# Patient Record
Sex: Male | Born: 1995 | Race: White | Hispanic: No | Marital: Single | Smoking: Current every day smoker
Health system: Southern US, Community
[De-identification: ages and names within clinical notes are randomized; demographics above are authoritative.]

## PROBLEM LIST (undated history)

## (undated) DIAGNOSIS — B019 Varicella without complication: Secondary | ICD-10-CM

## (undated) DIAGNOSIS — F329 Major depressive disorder, single episode, unspecified: Secondary | ICD-10-CM

## (undated) DIAGNOSIS — F909 Attention-deficit hyperactivity disorder, unspecified type: Secondary | ICD-10-CM

## (undated) DIAGNOSIS — F419 Anxiety disorder, unspecified: Secondary | ICD-10-CM

## (undated) DIAGNOSIS — F1291 Cannabis use, unspecified, in remission: Secondary | ICD-10-CM

## (undated) DIAGNOSIS — Z87898 Personal history of other specified conditions: Secondary | ICD-10-CM

## (undated) DIAGNOSIS — R55 Syncope and collapse: Secondary | ICD-10-CM

## (undated) DIAGNOSIS — F17210 Nicotine dependence, cigarettes, uncomplicated: Secondary | ICD-10-CM

## (undated) HISTORY — DX: Cannabis use, unspecified, in remission: F12.91

## (undated) HISTORY — DX: Syncope and collapse: R55

## (undated) HISTORY — DX: Nicotine dependence, cigarettes, uncomplicated: F17.210

## (undated) HISTORY — DX: Attention-deficit hyperactivity disorder, unspecified type: F90.9

## (undated) HISTORY — DX: Anxiety disorder, unspecified: F41.9

## (undated) HISTORY — DX: Major depressive disorder, single episode, unspecified: F32.9

## (undated) HISTORY — DX: Varicella without complication: B01.9

## (undated) HISTORY — DX: Personal history of other specified conditions: Z87.898

---
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
## 2017-04-14 IMAGING — CR DG CHEST 1V PORT
1 series · 3 of 3 positions shown · non-contrast
Comparison: None.

CLINICAL DATA: Unwitnessed fall with broken skateboard nearby.

EXAM:
PORTABLE CHEST - 1 VIEW

[Series 1: portable · 0.17mm/px · 3 of 3 slices shown]
[im 1/3]
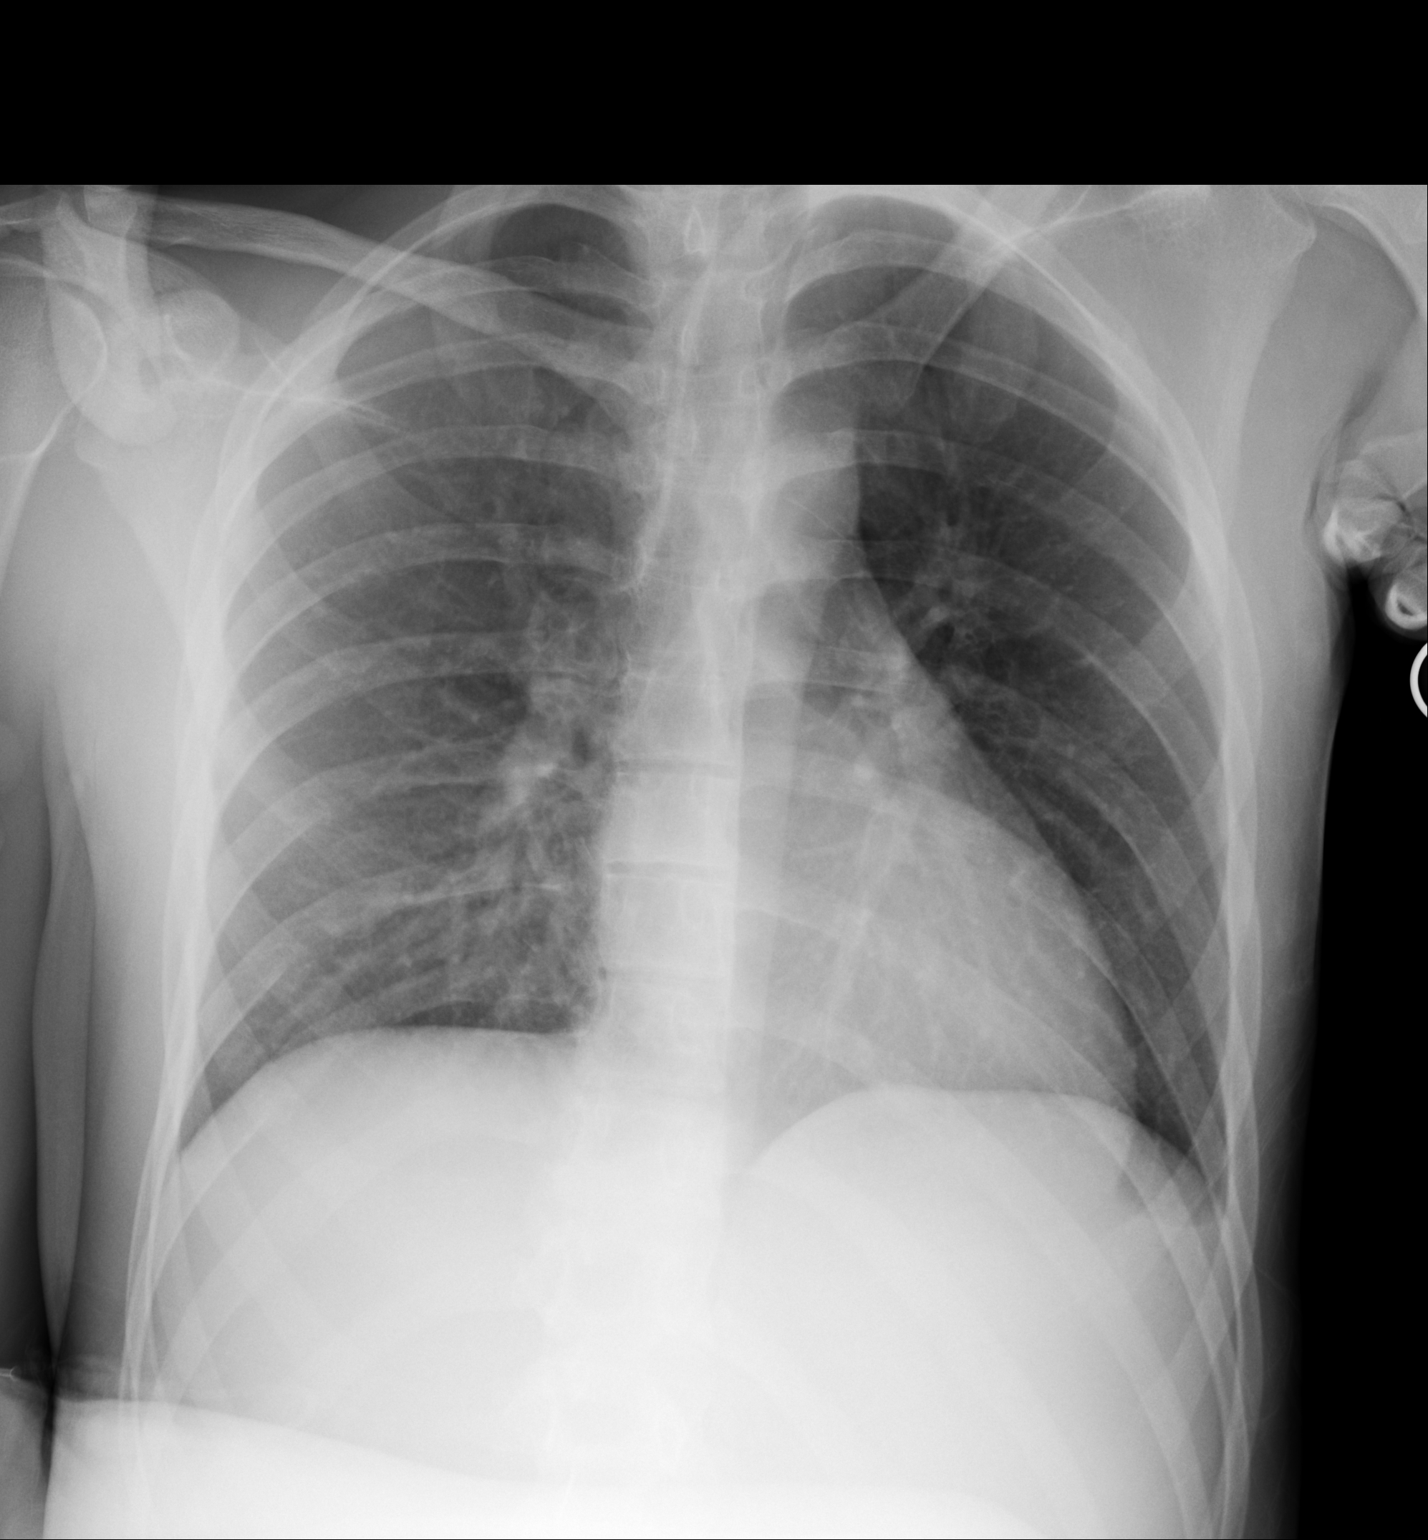
[im 2/3]
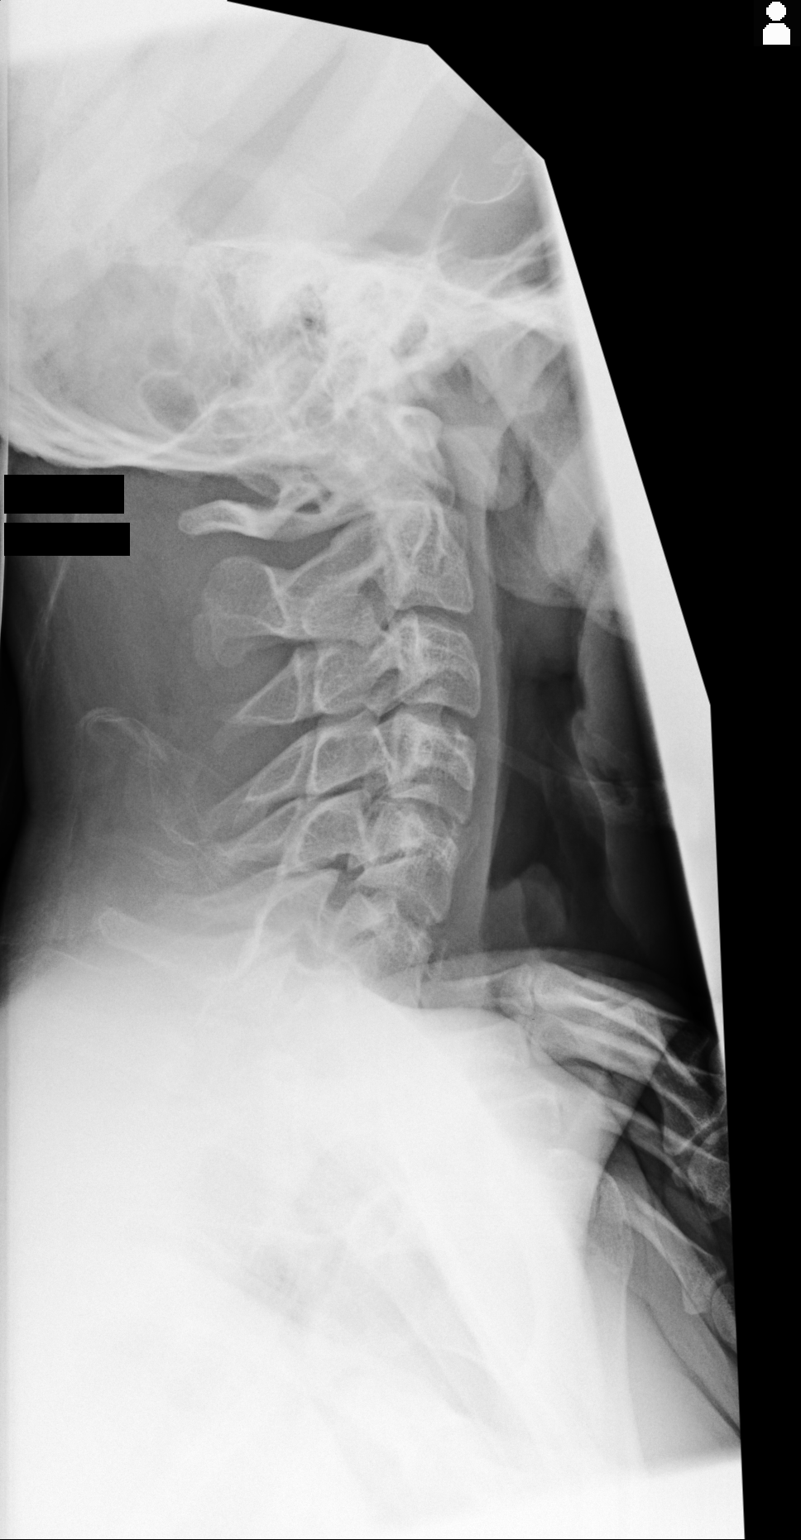
[im 3/3]
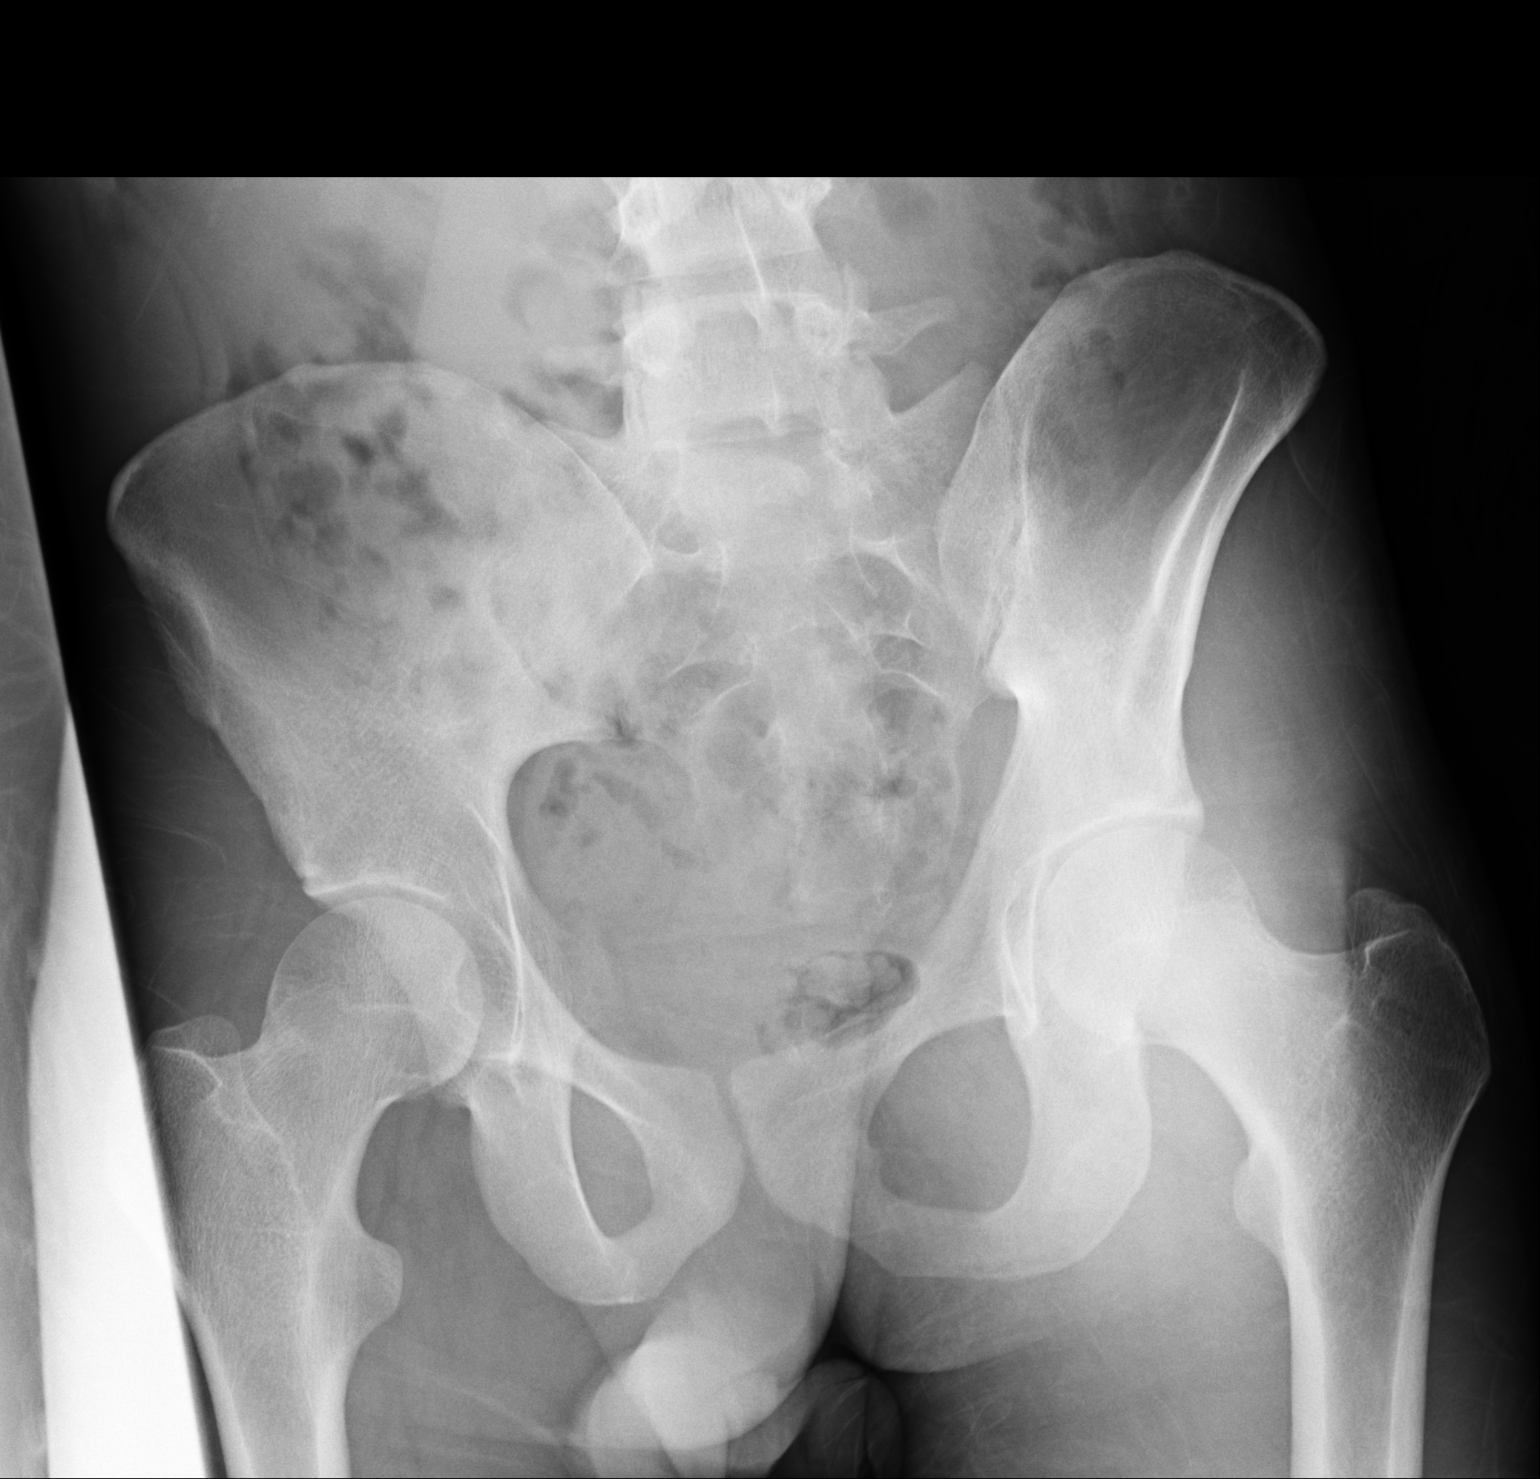

[3 of 3 positions shown; findings below may reference images not displayed]

FINDINGS: A single portable view of the chest is negative for pneumothorax or
large effusion. Mediastinal contours are normal. The lungs are
clear. No displaced fracture is evident.
IMPRESSION: No acute findings

## 2017-04-14 IMAGING — CR DG PORTABLE PELVIS
1 series · 1 of 1 positions shown · non-contrast
Comparison: None.

CLINICAL DATA: Status post unwitnessed fall. Lower back abrasions.
Concern for pelvic injury. Initial encounter.

EXAM:
PORTABLE PELVIS 1-2 VIEWS

[portable]
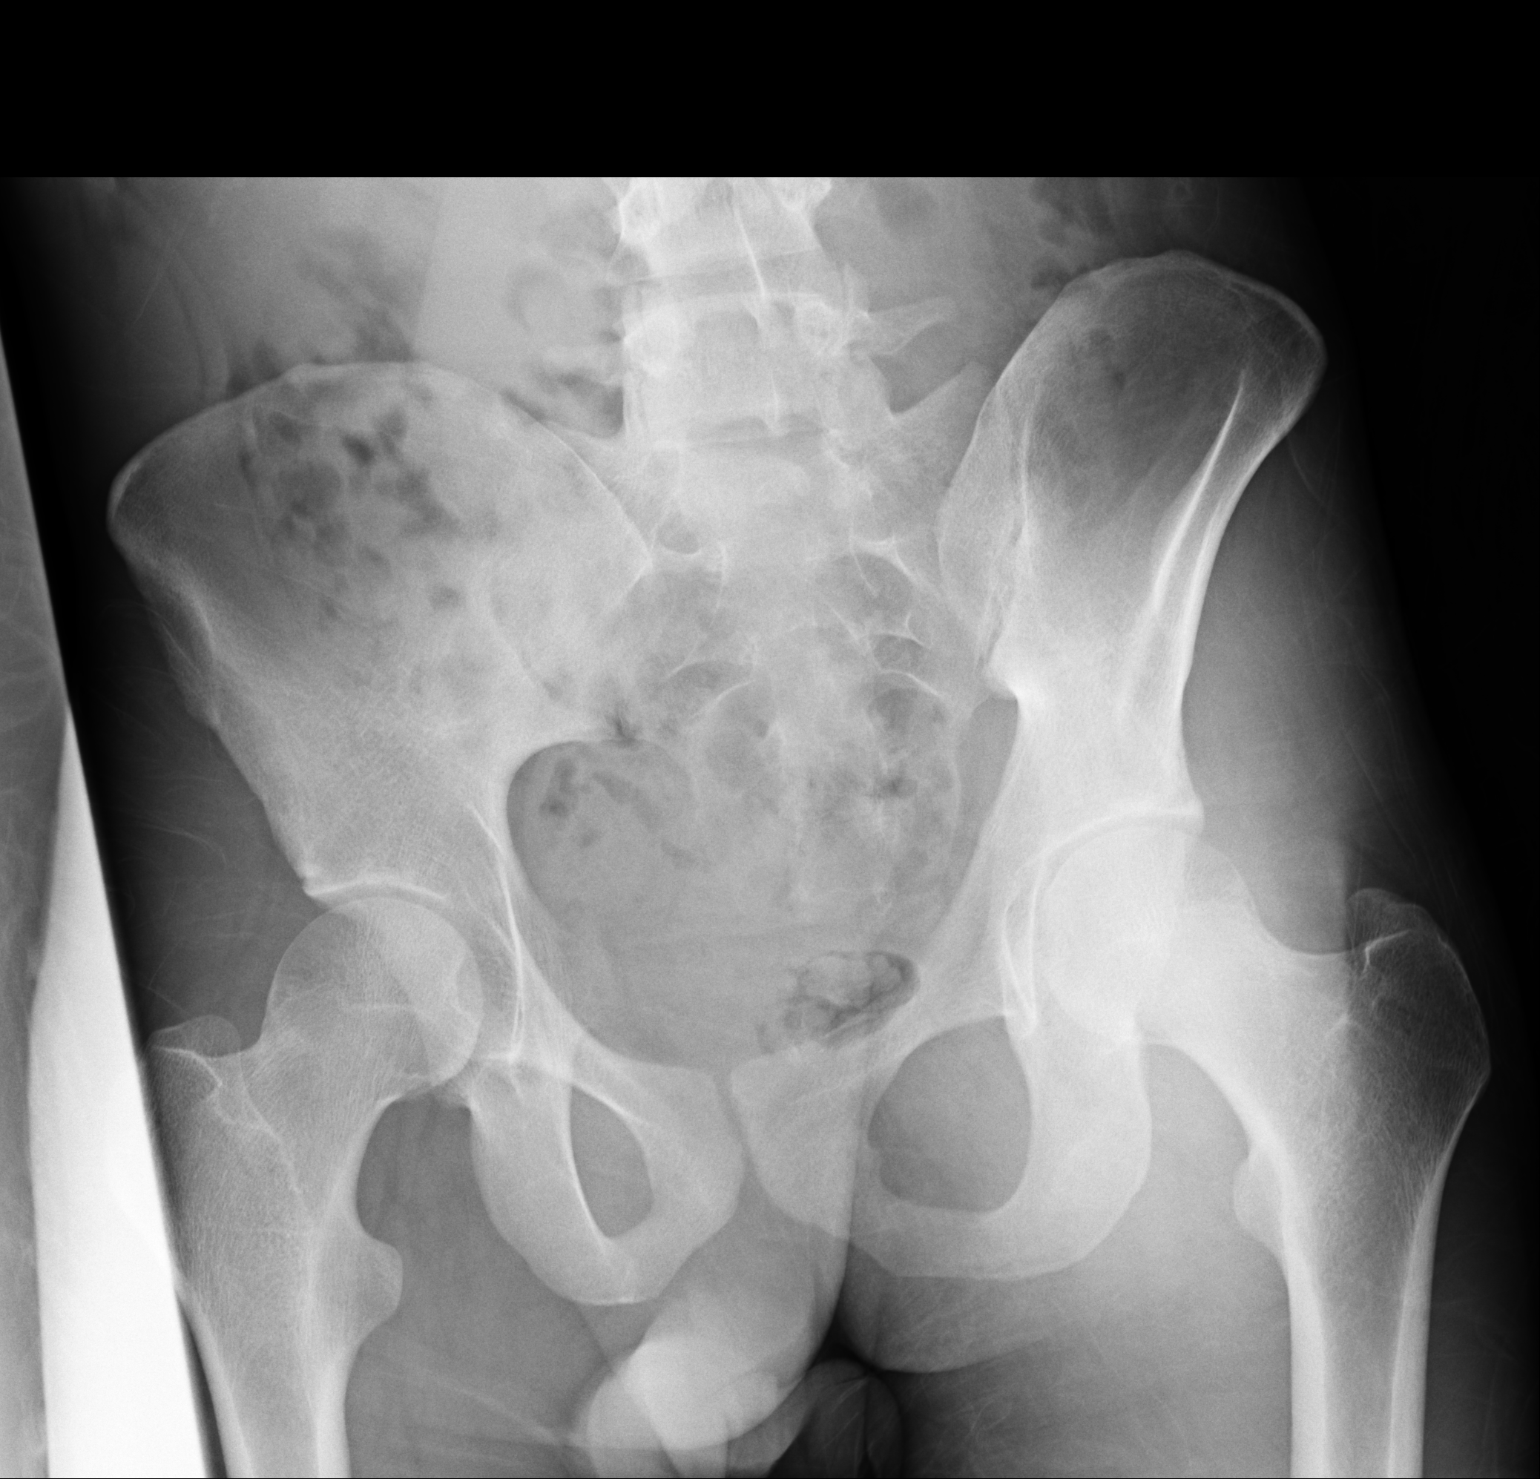

[1 of 1 positions shown; findings below may reference images not displayed]

FINDINGS: There is no evidence of fracture or dislocation. Both femoral heads
are seated normally within their respective acetabula. No
significant degenerative change is appreciated. The sacroiliac
joints are unremarkable in appearance.

The visualized bowel gas pattern is grossly unremarkable in
appearance.
IMPRESSION: No evidence of fracture or dislocation.
